# Patient Record
Sex: Male | Born: 1980 | Race: White | Hispanic: No | Marital: Married | Smoking: Never smoker
Health system: Southern US, Community
[De-identification: ages and names within clinical notes are randomized; demographics above are authoritative.]

---
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
## 2014-09-19 ENCOUNTER — Encounter: Payer: Self-pay | Admitting: Family Medicine

## 2014-09-19 ENCOUNTER — Ambulatory Visit (INDEPENDENT_AMBULATORY_CARE_PROVIDER_SITE_OTHER): Payer: BC Managed Care – PPO | Admitting: Family Medicine

## 2014-09-19 VITALS — BP 106/70 | HR 87 | Temp 98.8°F | Ht 72.0 in | Wt 212.5 lb

## 2014-09-19 DIAGNOSIS — Z0000 Encounter for general adult medical examination without abnormal findings: Secondary | ICD-10-CM | POA: Diagnosis not present

## 2014-09-19 DIAGNOSIS — Z7189 Other specified counseling: Secondary | ICD-10-CM

## 2014-09-19 NOTE — Patient Instructions (Signed)
Take care.  Glad to see you.  Keep exercising.  We'll request your records.  Tdap and flu.

## 2014-09-19 NOTE — Progress Notes (Signed)
Pre visit review using our clinic review tool, if applicable. No additional management support is needed unless otherwise documented below in the visit note.  New patient. Requesting records.   CPE- See plan.  Routine anticipatory guidance given to patient.  See health maintenance. Tetanus encouraged.  Flu encouraged.  PNA and shingles not due.  Colon and prostate cancer screening not due, d/w pt.  Diet and exercise are both good, walking, healthy diet.   Living will d/w pt.  Wife designated if patient were incapacitated.    He has some L sided MSK chest wall pain [facility]  Requesting records.    PMH and SH reviewed  Meds, vitals, and allergies reviewed.   ROS: See HPI.  Otherwise negative.    GEN: nad, alert and oriented HEENT: mucous membranes moist NECK: supple w/o LA CV: rrr. PULM: ctab, no inc wob ABD: soft, +bs EXT: no edema SKIN: no acute rash

## 2014-09-20 ENCOUNTER — Encounter: Payer: Self-pay | Admitting: Family Medicine

## 2014-09-20 DIAGNOSIS — Z0000 Encounter for general adult medical examination without abnormal findings: Secondary | ICD-10-CM | POA: Insufficient documentation

## 2014-09-20 DIAGNOSIS — Z7189 Other specified counseling: Secondary | ICD-10-CM | POA: Insufficient documentation

## 2014-09-20 NOTE — Assessment & Plan Note (Signed)
Living will d/w pt.  Wife designated if patient were incapacitated.   ?

## 2014-09-20 NOTE — Assessment & Plan Note (Signed)
Routine anticipatory guidance given to patient. See health maintenance.  Tetanus encouraged.  Flu encouraged.  PNA and shingles not due.  Colon and prostate cancer screening not due, d/w pt.  Diet and exercise are both good, walking, healthy diet.  Living will d/w pt. Wife designated if patient were incapacitated.

## 2018-06-30 ENCOUNTER — Other Ambulatory Visit: Payer: Self-pay

## 2018-06-30 ENCOUNTER — Observation Stay
Admission: EM | Admit: 2018-06-30 | Discharge: 2018-07-01 | Disposition: A | Discharge status: Home / Self Care | Payer: BC Managed Care – PPO | Attending: Surgery | Admitting: Surgery

## 2018-06-30 ENCOUNTER — Observation Stay: Payer: BC Managed Care – PPO | Admitting: Anesthesiology

## 2018-06-30 ENCOUNTER — Encounter: Payer: Self-pay | Admitting: Emergency Medicine

## 2018-06-30 ENCOUNTER — Encounter
Admission: EM | Disposition: A | Discharge status: Home / Self Care | Payer: Self-pay | Source: Home / Self Care | Attending: Emergency Medicine

## 2018-06-30 ENCOUNTER — Emergency Department: Payer: BC Managed Care – PPO

## 2018-06-30 DIAGNOSIS — K3532 Acute appendicitis with perforation and localized peritonitis, without abscess: Principal | ICD-10-CM | POA: Insufficient documentation

## 2018-06-30 DIAGNOSIS — K3580 Unspecified acute appendicitis: Secondary | ICD-10-CM | POA: Diagnosis present

## 2018-06-30 DIAGNOSIS — K3589 Other acute appendicitis without perforation or gangrene: Secondary | ICD-10-CM

## 2018-06-30 DIAGNOSIS — R1031 Right lower quadrant pain: Secondary | ICD-10-CM | POA: Diagnosis present

## 2018-06-30 HISTORY — PX: LAPAROSCOPIC APPENDECTOMY: SHX408

## 2018-06-30 LAB — URINALYSIS, COMPLETE (UACMP) WITH MICROSCOPIC
BACTERIA UA: NONE SEEN
Bilirubin Urine: NEGATIVE
Glucose, UA: NEGATIVE mg/dL
Hgb urine dipstick: NEGATIVE
Ketones, ur: NEGATIVE mg/dL
LEUKOCYTES UA: NEGATIVE
Nitrite: NEGATIVE
PH: 5 (ref 5.0–8.0)
Protein, ur: 100 mg/dL — AB
Specific Gravity, Urine: 1.029 (ref 1.005–1.030)

## 2018-06-30 LAB — CBC
HCT: 49.2 % (ref 39.0–52.0)
HEMOGLOBIN: 17.1 g/dL — AB (ref 13.0–17.0)
MCH: 28.9 pg (ref 26.0–34.0)
MCHC: 34.8 g/dL (ref 30.0–36.0)
MCV: 83.2 fL (ref 80.0–100.0)
NRBC: 0 % (ref 0.0–0.2)
Platelets: 263 10*3/uL (ref 150–400)
RBC: 5.91 MIL/uL — AB (ref 4.22–5.81)
RDW: 12.2 % (ref 11.5–15.5)
WBC: 12.4 10*3/uL — ABNORMAL HIGH (ref 4.0–10.5)

## 2018-06-30 LAB — COMPREHENSIVE METABOLIC PANEL
ALBUMIN: 4.3 g/dL (ref 3.5–5.0)
ALT: 35 U/L (ref 0–44)
ANION GAP: 10 (ref 5–15)
AST: 23 U/L (ref 15–41)
Alkaline Phosphatase: 90 U/L (ref 38–126)
BUN: 15 mg/dL (ref 6–20)
CO2: 22 mmol/L (ref 22–32)
Calcium: 8.7 mg/dL — ABNORMAL LOW (ref 8.9–10.3)
Chloride: 105 mmol/L (ref 98–111)
Creatinine, Ser: 0.99 mg/dL (ref 0.61–1.24)
GFR calc non Af Amer: >60 mL/min (ref >60)
Glucose, Bld: 88 mg/dL (ref 70–99)
Potassium: 3.7 mmol/L (ref 3.5–5.1)
SODIUM: 137 mmol/L (ref 135–145)
Total Bilirubin: 0.8 mg/dL (ref 0.3–1.2)
Total Protein: 7.8 g/dL (ref 6.5–8.1)

## 2018-06-30 LAB — LIPASE, BLOOD: Lipase: 24 U/L (ref 11–51)

## 2018-06-30 SURGERY — APPENDECTOMY, LAPAROSCOPIC
Anesthesia: General

## 2018-06-30 MED ORDER — MIDAZOLAM HCL 2 MG/2ML IJ SOLN
INTRAMUSCULAR | Status: AC
  Filled 2018-06-30: qty 2

## 2018-06-30 MED ORDER — DOCUSATE SODIUM 100 MG PO CAPS: 100.0000 mg | ORAL_CAPSULE | Freq: Two times a day (BID) | ORAL | Status: DC | PRN

## 2018-06-30 MED ORDER — TRAMADOL HCL 50 MG PO TABS
50.0000 mg | ORAL_TABLET | Freq: Four times a day (QID) | ORAL | Status: DC | PRN
  Administered 2018-07-01: 50 mg via ORAL
  Filled 2018-06-30: qty 1

## 2018-06-30 MED ORDER — ACETAMINOPHEN 10 MG/ML IV SOLN
INTRAVENOUS | Status: DC | PRN
  Administered 2018-06-30: 1000 mg via INTRAVENOUS

## 2018-06-30 MED ORDER — FENTANYL CITRATE (PF) 100 MCG/2ML IJ SOLN: 25.0000 ug | INTRAMUSCULAR | Status: DC | PRN

## 2018-06-30 MED ORDER — IOPAMIDOL (ISOVUE-300) INJECTION 61%
30.0000 mL | Freq: Once | INTRAVENOUS | Status: AC | PRN
  Administered 2018-06-30: 30 mL via ORAL
  Filled 2018-06-30: qty 30

## 2018-06-30 MED ORDER — LACTATED RINGERS IV SOLN
INTRAVENOUS | Status: DC
  Administered 2018-06-30 (×3): via INTRAVENOUS

## 2018-06-30 MED ORDER — LACTATED RINGERS IV SOLN: INTRAVENOUS | Status: DC

## 2018-06-30 MED ORDER — CEFAZOLIN SODIUM-DEXTROSE 2-3 GM-%(50ML) IV SOLR
INTRAVENOUS | Status: DC | PRN
  Administered 2018-06-30: 2 g via INTRAVENOUS

## 2018-06-30 MED ORDER — FENTANYL CITRATE (PF) 250 MCG/5ML IJ SOLN
INTRAMUSCULAR | Status: AC
  Filled 2018-06-30: qty 5

## 2018-06-30 MED ORDER — ROCURONIUM BROMIDE 100 MG/10ML IV SOLN
INTRAVENOUS | Status: DC | PRN
  Administered 2018-06-30: 40 mg via INTRAVENOUS
  Administered 2018-06-30: 10 mg via INTRAVENOUS
  Administered 2018-06-30: 30 mg via INTRAVENOUS

## 2018-06-30 MED ORDER — HYDROCODONE-ACETAMINOPHEN 5-325 MG PO TABS
1.0000 | ORAL_TABLET | ORAL | Status: DC | PRN
  Administered 2018-07-01: 1 via ORAL
  Filled 2018-06-30: qty 1

## 2018-06-30 MED ORDER — HYDROMORPHONE HCL 1 MG/ML IJ SOLN
INTRAMUSCULAR | Status: AC
  Filled 2018-06-30: qty 1

## 2018-06-30 MED ORDER — ONDANSETRON HCL 4 MG/2ML IJ SOLN
INTRAMUSCULAR | Status: AC
  Administered 2018-06-30: 4 mg
  Filled 2018-06-30: qty 2

## 2018-06-30 MED ORDER — HYDROMORPHONE HCL 1 MG/ML IJ SOLN
INTRAMUSCULAR | Status: DC | PRN
  Administered 2018-06-30 (×2): .5 mg via INTRAVENOUS

## 2018-06-30 MED ORDER — SUGAMMADEX SODIUM 200 MG/2ML IV SOLN
INTRAVENOUS | Status: DC | PRN
  Administered 2018-06-30: 190.6 mg via INTRAVENOUS

## 2018-06-30 MED ORDER — PROPOFOL 10 MG/ML IV BOLUS
INTRAVENOUS | Status: DC | PRN
  Administered 2018-06-30: 200 mg via INTRAVENOUS

## 2018-06-30 MED ORDER — ONDANSETRON 4 MG PO TBDP: 4.0000 mg | ORAL_TABLET | Freq: Four times a day (QID) | ORAL | Status: DC | PRN

## 2018-06-30 MED ORDER — LIDOCAINE HCL (CARDIAC) PF 100 MG/5ML IV SOSY
PREFILLED_SYRINGE | INTRAVENOUS | Status: DC | PRN
  Administered 2018-06-30: 100 mg via INTRAVENOUS

## 2018-06-30 MED ORDER — MIDAZOLAM HCL 2 MG/2ML IJ SOLN
INTRAMUSCULAR | Status: DC | PRN
  Administered 2018-06-30: 2 mg via INTRAVENOUS

## 2018-06-30 MED ORDER — ACETAMINOPHEN 10 MG/ML IV SOLN
INTRAVENOUS | Status: AC
  Filled 2018-06-30: qty 100

## 2018-06-30 MED ORDER — SUCCINYLCHOLINE CHLORIDE 20 MG/ML IJ SOLN
INTRAMUSCULAR | Status: DC | PRN
  Administered 2018-06-30: 120 mg via INTRAVENOUS

## 2018-06-30 MED ORDER — ONDANSETRON HCL 4 MG/2ML IJ SOLN: 4.0000 mg | Freq: Once | INTRAMUSCULAR | Status: DC | PRN

## 2018-06-30 MED ORDER — MENTHOL 3 MG MT LOZG
1.0000 | LOZENGE | OROMUCOSAL | Status: DC | PRN
  Administered 2018-07-01: 3 mg via ORAL
  Filled 2018-06-30: qty 9

## 2018-06-30 MED ORDER — BUPIVACAINE-EPINEPHRINE 0.5% -1:200000 IJ SOLN
INTRAMUSCULAR | Status: DC | PRN
  Administered 2018-06-30: 20 mL

## 2018-06-30 MED ORDER — PROPOFOL 10 MG/ML IV BOLUS
INTRAVENOUS | Status: AC
  Filled 2018-06-30: qty 20

## 2018-06-30 MED ORDER — MORPHINE SULFATE (PF) 2 MG/ML IV SOLN
2.0000 mg | INTRAVENOUS | Status: DC | PRN
  Administered 2018-06-30 – 2018-07-01 (×3): 2 mg via INTRAVENOUS
  Filled 2018-06-30 (×3): qty 1

## 2018-06-30 MED ORDER — IOHEXOL 300 MG/ML  SOLN
100.0000 mL | Freq: Once | INTRAMUSCULAR | Status: AC | PRN
  Administered 2018-06-30: 100 mL via INTRAVENOUS
  Filled 2018-06-30: qty 100

## 2018-06-30 MED ORDER — ONDANSETRON HCL 4 MG/2ML IJ SOLN
4.0000 mg | Freq: Four times a day (QID) | INTRAMUSCULAR | Status: DC | PRN
  Administered 2018-06-30: 4 mg via INTRAVENOUS

## 2018-06-30 MED ORDER — FENTANYL CITRATE (PF) 100 MCG/2ML IJ SOLN
INTRAMUSCULAR | Status: DC | PRN
  Administered 2018-06-30 (×5): 50 ug via INTRAVENOUS

## 2018-06-30 MED ORDER — DEXAMETHASONE SODIUM PHOSPHATE 10 MG/ML IJ SOLN
INTRAMUSCULAR | Status: DC | PRN
  Administered 2018-06-30: 10 mg via INTRAVENOUS

## 2018-06-30 SURGICAL SUPPLY — 37 items
APPLIER CLIP LOGIC TI 5 (MISCELLANEOUS) ×2 IMPLANT
BLADE SURG 15 STRL LF DISP TIS (BLADE) ×1 IMPLANT
BLADE SURG 15 STRL SS (BLADE) ×1
BLADE SURG SZ11 CARB STEEL (BLADE) ×2 IMPLANT
CANISTER SUCT 1200ML W/VALVE (MISCELLANEOUS) ×2 IMPLANT
CANNULA DILATOR 10 W/SLV (CANNULA) ×2 IMPLANT
COVER WAND RF STERILE (DRAPES) ×2 IMPLANT
CUTTER FLEX LINEAR 45M (STAPLE) ×2 IMPLANT
DERMABOND ADVANCED (GAUZE/BANDAGES/DRESSINGS) ×1
DERMABOND ADVANCED .7 DNX12 (GAUZE/BANDAGES/DRESSINGS) ×1 IMPLANT
ELECT REM PT RETURN 9FT ADLT (ELECTROSURGICAL) ×2
ELECTRODE REM PT RTRN 9FT ADLT (ELECTROSURGICAL) ×1 IMPLANT
GLOVE BIOGEL PI IND STRL 7.0 (GLOVE) ×1 IMPLANT
GLOVE BIOGEL PI INDICATOR 7.0 (GLOVE) ×1
GLOVE SURG SYN 7.0 (GLOVE) ×2 IMPLANT
GOWN STRL REUS W/ TWL LRG LVL3 (GOWN DISPOSABLE) ×1 IMPLANT
GOWN STRL REUS W/TWL LRG LVL3 (GOWN DISPOSABLE) ×1
GRASPER SUT TROCAR 14GX15 (MISCELLANEOUS) ×2 IMPLANT
HANDLE YANKAUER SUCT BULB TIP (MISCELLANEOUS) ×2 IMPLANT
IRRIGATION STRYKERFLOW (MISCELLANEOUS) IMPLANT
IRRIGATOR STRYKERFLOW (MISCELLANEOUS)
KIT TURNOVER KIT A (KITS) ×2 IMPLANT
LIGASURE LAP MARYLAND 5MM 37CM (ELECTROSURGICAL) ×2 IMPLANT
NEEDLE HYPO 22GX1.5 SAFETY (NEEDLE) ×2 IMPLANT
NEEDLE VERESS 14GA 120MM (NEEDLE) ×2 IMPLANT
PACK LAP CHOLECYSTECTOMY (MISCELLANEOUS) ×2 IMPLANT
POUCH ENDO CATCH 10MM SPEC (MISCELLANEOUS) ×2 IMPLANT
RELOAD 45 VASCULAR/THIN (ENDOMECHANICALS) ×2 IMPLANT
RELOAD STAPLE TA45 3.5 REG BLU (ENDOMECHANICALS) ×2 IMPLANT
SCISSORS METZENBAUM CVD 33 (INSTRUMENTS) ×2 IMPLANT
STAPLER SKIN PROX 35W (STAPLE) ×2 IMPLANT
SUT MNCRL AB 4-0 PS2 18 (SUTURE) ×2 IMPLANT
SUT VICRYL PLUS ABS 0 54 (SUTURE) ×2 IMPLANT
TRAY FOLEY MTR SLVR 16FR STAT (SET/KITS/TRAYS/PACK) ×2 IMPLANT
TROCAR XCEL 12X100 BLDLESS (ENDOMECHANICALS) ×2 IMPLANT
TROCAR XCEL NON-BLD 5MMX100MML (ENDOMECHANICALS) ×4 IMPLANT
TUBING INSUFFLATION (TUBING) ×2 IMPLANT

## 2018-06-30 NOTE — ED Notes (Signed)
Pt awaiting admission for surgery. Family remains at bedside.

## 2018-06-30 NOTE — ED Triage Notes (Signed)
Pt in via POV with complaints of RLQ abdominal pain x a few days, worsening last night, developing N/V/D today.  Vitals WDL.  NAD noted at this time.

## 2018-06-30 NOTE — H&P (Signed)
Subjective:   CC: acute appendicitis  HPI:  Kyle Hensley is a 38 y.o. male who is consulted by [facility] for evaluation of  above cc.  Symptoms were first noted 1 week ago. Pain was intermittent discomfort localized to RLQ, acute worsening last night.  Associated with N/V, exacerbated by nothing specific.    Past Medical History: none reportee  Past Surgical History: no surgery in the past  Family History: reviewed and not related to CC  Social History:  reports that he has never smoked. He has never used smokeless tobacco. He reports that he does not drink alcohol or use drugs.  Current Medications: none reported  Allergies:  Allergies as of 06/30/2018  . (No Known Allergies)    ROS:  General: Denies weight loss, weight gain, fatigue, fevers, chills, and night sweats. Eyes: Denies blurry vision, double vision, eye pain, itchy eyes, and tearing. Ears: Denies hearing loss, earache, and ringing in ears. Nose: Denies sinus pain, congestion, infections, runny nose, and nosebleeds. Mouth/throat: Denies hoarseness, sore throat, bleeding gums, and difficulty swallowing. Heart: Denies chest pain, palpitations, racing heart, irregular heartbeat, leg pain or swelling, and decreased activity tolerance. Respiratory: Denies breathing difficulty, shortness of breath, wheezing, cough, and sputum. GI: Denies change in appetite, heartburn, constipation, diarrhea, and blood in stool. GU: Denies difficulty urinating, pain with urinating, urgency, frequency, blood in urine,  Musculoskeletal: Denies joint stiffness, pain, swelling, muscle weakness, and pain. Skin: Denies rash, itching, mass, tumors, sores, and boils Neurologic: Denies headache, fainting, dizziness, seizures, numbness, and tingling. Psychiatric: Denies depression, anxiety, difficulty sleeping, and memory loss. Endocrine: Denies heat or cold intolerance, and increased thirst or urination. Blood/lymph: Denies easy bruising, easy bruising,  and swollen glands    Objective:     BP (!) 141/98 (BP Location: Left Arm)   Pulse 100   Temp 97.9 F (36.6 C) (Oral)   Resp 16   Ht 6' (1.829 m)   Wt 95.3 kg   SpO2 99%   BMI 28.48 kg/m    Constitutional :  alert, cooperative, appears stated age and no distress  Lymphatics/Throat:  no asymmetry, masses, or scars  Respiratory:  clear to auscultation bilaterally  Cardiovascular:  regular rate and rhythm  Gastrointestinal: soft, no guarding, but focal tenderness in RLQ.   Musculoskeletal: Steady gait and movement  Skin: Cool and moist  Psychiatric: Normal affect, non-agitated, not confused       LABS:  CMP Latest Ref Rng & Units 06/30/2018  Glucose 70 - 99 mg/dL 88  BUN 6 - 20 mg/dL 15  Creatinine 1.22 - 4.49 mg/dL 7.53  Sodium 005 - 110 mmol/L 137  Potassium 3.5 - 5.1 mmol/L 3.7  Chloride 98 - 111 mmol/L 105  CO2 22 - 32 mmol/L 22  Calcium 8.9 - 10.3 mg/dL 2.1(R)  Total Protein 6.5 - 8.1 g/dL 7.8  Total Bilirubin 0.3 - 1.2 mg/dL 0.8  Alkaline Phos 38 - 126 U/L 90  AST 15 - 41 U/L 23  ALT 0 - 44 U/L 35   CBC Latest Ref Rng & Units 06/30/2018  WBC 4.0 - 10.5 K/uL 12.4(H)  Hemoglobin 13.0 - 17.0 g/dL 17.1(H)  Hematocrit 39.0 - 52.0 % 49.2  Platelets 150 - 400 K/uL 263     RADS: CLINICAL DATA:  Right lower quadrant abdomen pain for few days.  EXAM: CT ABDOMEN AND PELVIS WITH CONTRAST  TECHNIQUE: Multidetector CT imaging of the abdomen and pelvis was performed using the standard protocol following bolus administration of intravenous  contrast.  CONTRAST:  100mL OMNIPAQUE IOHEXOL 300 MG/ML  SOLN  COMPARISON:  None.  FINDINGS: Lower chest: Minimal dependent atelectasis of lung bases are noted. The heart size is normal.  Hepatobiliary: No focal liver abnormality is seen. No gallstones, gallbladder wall thickening, or biliary dilatation.  Pancreas: Unremarkable. No pancreatic ductal dilatation or surrounding inflammatory changes.  Spleen: Normal  in size without focal abnormality.  Adrenals/Urinary Tract: Adrenal glands are unremarkable. Kidneys are normal, without renal calculi, focal lesion, or hydronephrosis. Bladder is unremarkable.  Stomach/Bowel: The appendix is enlarged with wall enhancement and surrounding inflammation. There is a 2 mm calcific density in the distal appendix. The findings are consistent with acute appendicitis. No focal abscess is noted. There is no small bowel obstruction or diverticulitis. There is a small hiatal hernia.  Vascular/Lymphatic: No significant vascular findings are present. No enlarged abdominal or pelvic lymph nodes.  Reproductive: Prostate is unremarkable.  Other: No abdominal wall hernia or abnormality. No abdominopelvic ascites.  Musculoskeletal: No acute abnormality.  IMPRESSION: Findings consistent with acute appendicitis.  These results will be called to the ordering clinician or representative by the Radiologist Assistant, and communication documented in the PACS or zVision Dashboard.   Electronically Signed   By: Sherian ReinWei-Chen  Lin M.D.   On: 06/30/2018 15:17  Assessment:      Acute appendicitis  Plan:      Discussed the risk of surgery including post-op infxn, seroma, hematoma, abscess formation, chronic pain, poor-delayed wound healing, possible bowel resection, possible ostomy, possible conversion to open procedure, post-op SBO or ileus, and need for additional procedures to address said risks.  The risks of general anesthetic including MI, CVA, sudden death or even reaction to anesthetic medications also discussed. Alternatives include continued observation, or antibiotic treatment.  Benefits include possible symptom relief,   Typical post operative recovery of 3-5 days rest, also discussed.  The patient understands the risks, any and all questions were answered to the patient's satisfaction.  Will proceed with lap appy today.  IV abx, IVF, NPO in the  meantime

## 2018-06-30 NOTE — Transfer of Care (Signed)
Immediate Anesthesia Transfer of Care Note  Patient: Kyle Hensley  Procedure(s) Performed: APPENDECTOMY LAPAROSCOPIC (N/A )  Patient Location: PACU  Anesthesia Type:General  Level of Consciousness: sedated  Airway & Oxygen Therapy: Patient Spontanous Breathing and Patient connected to face mask oxygen  Post-op Assessment: Report given to RN and Post -op Vital signs reviewed and stable  Post vital signs: Reviewed and stable  Last Vitals:  Vitals Value Taken Time  BP    Temp    Pulse 106 06/30/2018  9:49 PM  Resp 15 06/30/2018  9:49 PM  SpO2 100 % 06/30/2018  9:49 PM  Vitals shown include unvalidated device data.  Last Pain:  Vitals:   06/30/18 1941  TempSrc:   PainSc: 4          Complications: No apparent anesthesia complications

## 2018-06-30 NOTE — ED Provider Notes (Signed)
[facility] Emergency Department Provider Note     ____________________________________________   First MD Initiated Contact with Patient 06/30/18 1325     (approximate)  I have reviewed the triage vital signs and the nursing notes.   HISTORY  Chief Complaint Abdominal Pain   HPI Kyle Hensley is a 38 y.o. male patient reports intermittent right lower quadrant pain for several days.  Is been getting worse started yesterday again lasted all night long is present now still getting worse does not feel any lumps in his groin went to urgent care was sent here.  He had one episode of nausea vomiting and diarrhea on arriving here.  His appetite has not been there since last night.   History reviewed. No pertinent past medical history.  Patient Active Problem List   Diagnosis Date Noted  . Routine general medical examination at a health care facility 09/20/2014  . Advance care planning 09/20/2014    History reviewed. No pertinent surgical history.  Prior to Admission medications   Medication Sig Start Date End Date Taking? Authorizing Provider  famotidine (PEPCID) 10 MG tablet Take 10 mg by mouth daily.    [provider]    Allergies Patient has no known allergies.  Family History  Problem Relation Age of Onset  . Colon cancer Neg Hx   . Prostate cancer Neg Hx     Social History Social History   Tobacco Use  . Smoking status: Never Smoker  . Smokeless tobacco: Never Used  Substance Use Topics  . Alcohol use: Never    Alcohol/week: 0.0 standard drinks    Frequency: Never  . Drug use: Never    Review of Systems  Constitutional: No fever/chills Eyes: No visual changes. ENT: No sore throat. Cardiovascular: Denies chest pain. Respiratory: Denies shortness of breath. Gastrointestinal: See HPI Genitourinary: Negative for dysuria. Musculoskeletal: Negative for back pain. Skin: Negative for rash. Neurological: Negative for  headaches, focal weakness   ____________________________________________   PHYSICAL EXAM:  VITAL SIGNS: ED Triage Vitals  Enc Vitals Group     BP 06/30/18 1244 (!) 141/98     Pulse Rate 06/30/18 1244 100     Resp 06/30/18 1244 16     Temp 06/30/18 1244 97.9 F (36.6 C)     Temp Source 06/30/18 1244 Oral     SpO2 06/30/18 1244 99 %     Weight 06/30/18 1245 210 lb (95.3 kg)     Height 06/30/18 1245 6' (1.829 m)     Head Circumference --      Peak Flow --      Pain Score 06/30/18 1245 3     Pain Loc --      Pain Edu? --      Excl. in GC? --     Constitutional: Alert and oriented. Well appearing and in no acute distress. Eyes: Conjunctivae are normal.  Head: Atraumatic. Nose: No congestion/rhinnorhea. Mouth/Throat: Mucous membranes are moist.  Oropharynx non-erythematous. Neck: No stridor.  Cardiovascular: Normal rate, regular rhythm. Grossly normal heart sounds.  Good peripheral circulation. Respiratory: Normal respiratory effort.  No retractions. Lungs CTAB. Gastrointestinal: Soft mildly diffusely tender to palpation percussion worse in the right lower quadrant.  Palpation most the rest of the abdomen makes it hurt in the right lower quadrant as well.  No distention. No abdominal bruits. No CVA tenderness. Musculoskeletal: No lower extremity tenderness nor edema.  No joint effusions. Neurologic:  Normal speech and language. No gross focal neurologic deficits  are appreciated. No gait instability. Skin:  Skin is warm, dry and intact. No rash noted. Psychiatric: Mood and affect are normal. Speech and behavior are normal.  ____________________________________________   LABS (all labs ordered are listed, but only abnormal results are displayed)  Labs Reviewed  COMPREHENSIVE METABOLIC PANEL - Abnormal; Notable for the following components:      Result Value   Calcium 8.7 (*)    All other components within normal limits  CBC - Abnormal; Notable for the following components:     WBC 12.4 (*)    RBC 5.91 (*)    Hemoglobin 17.1 (*)    All other components within normal limits  URINALYSIS, COMPLETE (UACMP) WITH MICROSCOPIC - Abnormal; Notable for the following components:   Color, Urine YELLOW (*)    APPearance HAZY (*)    Protein, ur 100 (*)    All other components within normal limits  LIPASE, BLOOD   ____________________________________________  EKG   ____________________________________________  RADIOLOGY  ED MD interpretation CT report called by radiology is acute appendicitis  Official radiology report(s): Ct Abdomen Pelvis W Contrast  Result Date: 06/30/2018 CLINICAL DATA:  Right lower quadrant abdomen pain for few days. EXAM: CT ABDOMEN AND PELVIS WITH CONTRAST TECHNIQUE: Multidetector CT imaging of the abdomen and pelvis was performed using the standard protocol following bolus administration of intravenous contrast. CONTRAST:  OMNIPAQUE IOHEXOL 300 MG/ML  SOLN COMPARISON:  None. FINDINGS: Lower chest: Minimal dependent atelectasis of lung bases are noted. The heart size is normal. Hepatobiliary: No focal liver abnormality is seen. No gallstones, gallbladder wall thickening, or biliary dilatation. Pancreas: Unremarkable. No pancreatic ductal dilatation or surrounding inflammatory changes. Spleen: Normal in size without focal abnormality. Adrenals/Urinary Tract: Adrenal glands are unremarkable. Kidneys are normal, without renal calculi, focal lesion, or hydronephrosis. Bladder is unremarkable. Stomach/Bowel: The appendix is enlarged with wall enhancement and surrounding inflammation. There is a 2 mm calcific density in the distal appendix. The findings are consistent with acute appendicitis. No focal abscess is noted. There is no small bowel obstruction or diverticulitis. There is a small hiatal hernia. Vascular/Lymphatic: No significant vascular findings are present. No enlarged abdominal or pelvic lymph nodes. Reproductive: Prostate is unremarkable.  Other: No abdominal wall hernia or abnormality. No abdominopelvic ascites. Musculoskeletal: No acute abnormality. IMPRESSION: Findings consistent with acute appendicitis. These results will be called to the ordering clinician or representative by the Radiologist Assistant, and communication documented in the PACS or zVision Dashboard. Electronically Signed   By: Sherian Rein M.D.   On: 06/30/2018 15:17    ____________________________________________   PROCEDURES  Procedure(s) performed:   Procedures  Critical Care performed: ____________________________________________   INITIAL IMPRESSION / ASSESSMENT AND PLAN / ED COURSE Dr. Artis Delay surgery was called he is coming in to see the patient.          ____________________________________________   FINAL CLINICAL IMPRESSION(S) / ED DIAGNOSES  Final diagnoses:  Acute appendicitis, unspecified acute appendicitis type     ED Discharge Orders    None       Note:  This document was prepared using Dragon voice recognition software and may include unintentional dictation errors.    Arnaldo Natal, MD 06/30/18 [phone]

## 2018-06-30 NOTE — Progress Notes (Signed)
Pt states throat really sore  Dr Maisie Fus called  Pt uvula swollen   Ordered cepacol

## 2018-06-30 NOTE — Anesthesia Post-op Follow-up Note (Signed)
Anesthesia QCDR form completed.        

## 2018-06-30 NOTE — Anesthesia Preprocedure Evaluation (Signed)
Anesthesia Evaluation  Patient identified by MRN, date of birth, ID band Patient awake    Reviewed: Allergy & Precautions, NPO status , Patient's Chart, lab work & pertinent test results, reviewed documented beta blocker date and time   Airway Mallampati: II  TM Distance: >3 FB     Dental  (+) Chipped   Pulmonary           Cardiovascular      Neuro/Psych    GI/Hepatic   Endo/Other    Renal/GU      Musculoskeletal   Abdominal   Peds  Hematology   Anesthesia Other Findings   Reproductive/Obstetrics                             Anesthesia Physical Anesthesia Plan  ASA: II  Anesthesia Plan: General   Post-op Pain Management:    Induction: Intravenous  PONV Risk Score and Plan:   Airway Management Planned: Oral ETT  Additional Equipment:   Intra-op Plan:   Post-operative Plan:   Informed Consent: I have reviewed the patients History and Physical, chart, labs and discussed the procedure including the risks, benefits and alternatives for the proposed anesthesia with the patient or authorized representative who has indicated his/her understanding and acceptance.     Plan Discussed with: CRNA  Anesthesia Plan Comments:         Anesthesia Quick Evaluation  

## 2018-06-30 NOTE — Anesthesia Procedure Notes (Signed)
Procedure Name: Intubation Date/Time: 06/30/2018 8:28 PM Performed by: Nelda Marseille, CRNA Pre-anesthesia Checklist: Patient identified, Patient being monitored, Timeout performed, Emergency Drugs available and Suction available Patient Re-evaluated:Patient Re-evaluated prior to induction Oxygen Delivery Method: Circle system utilized Preoxygenation: Pre-oxygenation with 100% oxygen Induction Type: IV induction Ventilation: Mask ventilation without difficulty Laryngoscope Size: Mac, 3 and Glidescope Grade View: Grade II Tube type: Oral Tube size: 7.5 mm Number of attempts: 1 Airway Equipment and Method: Stylet Placement Confirmation: ETT inserted through vocal cords under direct vision,  positive ETCO2 and breath sounds checked- equal and bilateral Secured at: 21 cm Tube secured with: Tape Dental Injury: Teeth and Oropharynx as per pre-operative assessment

## 2018-07-01 ENCOUNTER — Encounter: Payer: Self-pay | Admitting: Surgery

## 2018-07-01 LAB — CBC
HEMATOCRIT: 42.4 % (ref 39.0–52.0)
Hemoglobin: 14.7 g/dL (ref 13.0–17.0)
MCH: 29 pg (ref 26.0–34.0)
MCHC: 34.7 g/dL (ref 30.0–36.0)
MCV: 83.6 fL (ref 80.0–100.0)
Platelets: 245 10*3/uL (ref 150–400)
RBC: 5.07 MIL/uL (ref 4.22–5.81)
RDW: 12.3 % (ref 11.5–15.5)
WBC: 6.8 10*3/uL (ref 4.0–10.5)
nRBC: 0 % (ref 0.0–0.2)

## 2018-07-01 LAB — MAGNESIUM: Magnesium: 1.9 mg/dL (ref 1.7–2.4)

## 2018-07-01 LAB — BASIC METABOLIC PANEL
Anion gap: 9 (ref 5–15)
BUN: 13 mg/dL (ref 6–20)
CO2: 25 mmol/L (ref 22–32)
Calcium: 8.2 mg/dL — ABNORMAL LOW (ref 8.9–10.3)
Chloride: 100 mmol/L (ref 98–111)
Creatinine, Ser: 1.11 mg/dL (ref 0.61–1.24)
GFR calc Af Amer: >60 mL/min (ref >60)
GFR calc non Af Amer: >60 mL/min (ref >60)
Glucose, Bld: 150 mg/dL — ABNORMAL HIGH (ref 70–99)
Potassium: 4 mmol/L (ref 3.5–5.1)
Sodium: 134 mmol/L — ABNORMAL LOW (ref 135–145)

## 2018-07-01 LAB — PHOSPHORUS: Phosphorus: 3 mg/dL (ref 2.5–4.6)

## 2018-07-01 MED ORDER — ACETAMINOPHEN 325 MG PO TABS: 650.0000 mg | ORAL_TABLET | Freq: Three times a day (TID) | ORAL | 0 refills | Status: AC | PRN

## 2018-07-01 MED ORDER — HYDROCODONE-ACETAMINOPHEN 5-325 MG PO TABS: 1.0000 | ORAL_TABLET | Freq: Four times a day (QID) | ORAL | 0 refills | Status: AC | PRN

## 2018-07-01 MED ORDER — DOCUSATE SODIUM 100 MG PO CAPS: 100.0000 mg | ORAL_CAPSULE | Freq: Two times a day (BID) | ORAL | 0 refills | Status: AC | PRN

## 2018-07-01 MED ORDER — IBUPROFEN 800 MG PO TABS: 800.0000 mg | ORAL_TABLET | Freq: Three times a day (TID) | ORAL | 0 refills | Status: DC | PRN

## 2018-07-01 NOTE — Anesthesia Postprocedure Evaluation (Signed)
Anesthesia Post Note  Patient: Neomia DearKevin Schmale  Procedure(s) Performed: APPENDECTOMY LAPAROSCOPIC (N/A )  Patient location during evaluation: PACU Anesthesia Type: General Level of consciousness: awake and alert Pain management: pain level controlled Vital Signs Assessment: post-procedure vital signs reviewed and stable Respiratory status: spontaneous breathing, nonlabored ventilation, respiratory function stable and patient connected to nasal cannula oxygen Cardiovascular status: blood pressure returned to baseline and stable Postop Assessment: no apparent nausea or vomiting Anesthetic complications: no     Last Vitals:  Vitals:   06/30/18 2340 07/01/18 0121  BP: 113/76 105/67  Pulse: 97 89  Resp: 16 16  Temp: 36.8 C 36.7 C  SpO2: 94% 97%    Last Pain:  Vitals:   07/01/18 0121  TempSrc: Oral  PainSc:                  Eleisha Branscomb S

## 2018-07-01 NOTE — Discharge Instructions (Signed)
Laparoscopic Appendectomy, Adult, Care After This sheet gives you information about how to care for yourself after your procedure. Your health care provider may also give you more specific instructions. If you have problems or questions, contact your health care provider. What can I expect after the procedure? After the procedure, it is common to have:  Little energy for normal activities.  Mild pain in the area where the incisions were made.  Difficulty passing stool (constipation). This can be caused by: ? Pain medicine. ? A decrease in your activity. Follow these instructions at home: Medicines  tylenol and advil as needed for discomfort.  Please alternate between the two every four hours as needed for pain.    Use narcotics, if prescribed, only when tylenol and motrin is not enough to control pain.  325-650mg  every 8hrs to max of 4000mg /24hrs (including the 325mg  in every norco dose) for the tylenol.    Advil up to 800mg  per dose every 8hrs as needed for pain.    Do not drive or use heavy machinery while taking prescription pain medicine.  Ask your health care provider if the medicine prescribed to you can cause constipation. You may need to take steps to prevent or treat constipation, such as: ? Drink enough fluid to keep your urine pale yellow. ? Take over-the-counter or prescription medicines. ? Eat foods that are high in fiber, such as beans, whole grains, and fresh fruits and vegetables. ? Limit foods that are high in fat and processed sugars, such as fried or sweet foods. Incision care   Follow instructions from your health care provider about how to take care of your incisions. Make sure you: ? Wash your hands with soap and water before and after you change your bandage (dressing). If soap and water are not available, use hand sanitizer. ? OK TO REMOVE   Check your incision areas every day for signs of infection. Check for: ? Redness, swelling, or pain. ? Fluid or  blood. ? Warmth. ? Pus or a bad smell. Bathing  Keep your incisions clean and dry. Clean them as often as told by your health care provider. To do this: 1. Gently wash the incisions with soap and water. 2. Rinse the incisions with water to remove all soap. 3. Pat the incisions dry with a clean towel. Do not rub the incisions.  Do not take baths, swim, or use a hot tub for 2 weeks, or until your health care provider approves. You may take shower LATER TONIGHT AFTER REMOVING THE OUTER DRESSINGS. Activity  Do not drive for 24 hours if you were given a sedative during your procedure.  Rest after the procedure. OK TO RETURN TO WORK ON Monday 07/05/18.  Ask your health care provider what activities are safe for you. General instructions  Take deep breaths. This helps to prevent your lungs from developing an infection (pneumonia).  Keep all follow-up visits as told by your health care provider. This is important. Contact a health care provider if:  You have redness, swelling, or pain around an incision.  You have fluid or blood coming from an incision.  Your incision feels warm to the touch.  You have pus or a bad smell coming from an incision or dressing.  Your incision edges break open after your sutures have been removed.  You have increasing pain in your shoulders.  You feel dizzy or you faint.  You develop shortness of breath.  You keep feeling nauseous or you are vomiting.  You have diarrhea or you cannot control your bowel functions.  You lose your appetite.  You develop swelling or pain in your legs.  You develop a rash. Get help right away if you have:  A fever.  Difficulty breathing.  Sharp pains in your chest. Summary  After a laparoscopic appendectomy, it is common to have little energy for normal activities, mild pain in the area of the incisions, and constipation.  Infection is the most common complication after this procedure. Follow your health care  provider's instructions about caring for yourself after the procedure.  Rest after the procedure. Return to your normal activities as told by your health care provider.  Contact your health care provider if you notice signs of infection around your incisions or you develop shortness of breath. Get help right away if you have a fever, chest pain, or difficulty breathing. This information is not intended to replace advice given to you by your health care provider. Make sure you discuss any questions you have with your health care provider. Document Released: 06/16/2005 Document Revised: 12/17/2017 Document Reviewed: 12/17/2017 [employer] Interactive Patient Education  2019 [employer]

## 2018-07-01 NOTE — Op Note (Addendum)
Preoperative diagnosis: Acute appendicitis.  Postoperative diagnosis: Acute appendicitis  Procedure: Laparoscopic appendectomy.  Anesthesia: GETA  Surgeon: Sung Amabile  Wound Classification: clean contaminated  Specimen: Appendix  Complications: None  Estimated Blood Loss: 3 mL   Indications: Patient is a 38 y.o. male  presented with right lower quadrant pain.  Computed tomography scan and physical examination were consistent with acute appendicitis.   Findings: 1. Acutely inflamed appendix 2. No peri-appendiceal abscess or phlegmon 3. Normal anatomy 4. Appendiceal artery ligated and divided with EndoGIA 5. Adequate hemostasis.   Description of procedure: The patient was placed on the operating table in the supine position, left arm tucked. General anesthesia was induced. A time-out was completed verifying correct patient, procedure, site, positioning, and implant(s) and/or special equipment prior to beginning this procedure. A Foley catheter placed. The abdomen was prepped and draped in the usual sterile fashion.   After local was infused, an incision was made inferior to the umbilicus.  The fascia was elevated with Coker's and Veress needle introduced and abdomen insufflated with carbon dioxide to a pressure of 15 mmHg after confirming two clicks on the needle and positive saline drop test. The patient tolerated insufflation well.  Needle removed and 69mm port placed via optiview and the abdomen inspected. No injuries from initial trocar placement were noted. One 3mm port  was then placed above the symphysis pubis on midline and 3mm port in LLQ under direct vision.  Care was taken to avoid injury to the bladder or inferior epigastric vessels. The table was placed in the Trendelenburg position with the right side elevated.  An inflamed appendix was identified and elevated.  It was noted to be adjacent to lateral wall of colon before making a sharp turn back toward the pelvis.  Window  created at base of appendix in the mesentery.   An endoscopic blue load linear cutting stapler was then used to divide and staple the base of the appendix.  It was reloaded with a vascular cartridge and the mesoappendix similarly divided after further blunt dissection and ligasure device used to isolate the mesoappendix and inflammed appendix from lateral wall and colon.  Bleeding artery noted in staple line that was easily controlled with a endoclip.  The appendix was placed in an endoscopic retrieval bag and removed through the 74mm port after some blunt dissection within the bag due to bulky inflammed tissue.  The bag was noted to have a small tear once it was pulled through.   The appendiceal stump was examined and hemostasis noted. Scant serosanguinous fluid and minimal blood suctioned out.  No other pathology was identified within pelvis.  54mm port site closed via PMI using 0 vicrly.  Remaining trocars were removed under direct vision. No bleeding was noted. The abdomen was allowed to collapse.  Extensive irrigation of 54mm port site perfomed prior to all skin incisions closed with staples due to the breach of the endocatch bag.  Wounds then dressed with 2x2 and tegaderm.  The patient tolerated the procedure well, foley removed, awakened from anesthesia and was taken to the postanesthesia care unit in satisfactory condition.  Sponge count and instrument count correct at the end of the procedure.

## 2018-07-01 NOTE — Discharge Summary (Signed)
Physician Discharge Summary  Patient ID: Kyle Hensley MRN: [id number] DOB/AGE: 36-12-1980 36 y.o.  Admit date: 06/30/2018 Discharge date: 07/01/2018  Admission Diagnoses: ACUTE APPENDICITIS  Discharge Diagnoses:  Same as above  Discharged Condition: good  Hospital Course: dx with acute appy. Underwent lap appy, see op note for details.  Recovered well postop  Consults: None  Discharge Exam: Blood pressure 108/72, pulse 88, temperature 97.8 F (36.6 C), temperature source Oral, resp. rate 16, height 6' (1.829 m), weight 98.5 kg, SpO2 95 %. General appearance: alert, cooperative and no distress GI: soft, no guarding, appropriate tendernss at incision sites  Disposition:  Discharge disposition: 01-Home or Self Care       Discharge Instructions    Discharge patient   Complete by:  As directed    Discharge disposition:  01-Home or Self Care   Discharge patient date:  07/01/2018     Allergies as of 07/01/2018   No Known Allergies     Medication List    TAKE these medications   acetaminophen 325 MG tablet Commonly known as:  TYLENOL Take 2 tablets (650 mg total) by mouth every 8 (eight) hours as needed for mild pain.   docusate sodium 100 MG capsule Commonly known as:  COLACE Take 1 capsule (100 mg total) by mouth 2 (two) times daily as needed for up to 10 days for mild constipation.   HYDROcodone-acetaminophen 5-325 MG tablet Commonly known as:  NORCO Take 1 tablet by mouth every 6 (six) hours as needed for up to 3 days for moderate pain.   ibuprofen 800 MG tablet Commonly known as:  ADVIL,MOTRIN Take 1 tablet (800 mg total) by mouth every 8 (eight) hours as needed for mild pain or moderate pain.      Follow-up Information    Kalila Adkison, DO Follow up in 1 week(s).   Specialty:  Surgery Why:  for wound check and possible staple removal Contact information: [address] [place] [address] [phone]            Total time spent arranging  discharge was >87min. Signed: Sung Amabile 07/01/2018, 10:21 AM

## 2018-07-02 LAB — SURGICAL PATHOLOGY

## 2018-07-02 LAB — HIV ANTIBODY (ROUTINE TESTING W REFLEX): HIV Screen 4th Generation wRfx: NONREACTIVE

## 2019-09-08 ENCOUNTER — Ambulatory Visit: Payer: BC Managed Care – PPO | Attending: Family

## 2019-09-08 DIAGNOSIS — Z23 Encounter for immunization: Secondary | ICD-10-CM

## 2019-09-08 NOTE — Progress Notes (Signed)
   Covid-19 Vaccination Clinic  Name:  Kyle Hensley    MRN: [id number] DOB: 07-10-1980  09/08/2019  Mr. Kyle Hensley was observed post Covid-19 immunization for 15 minutes without incident. He was provided with Vaccine Information Sheet and instruction to access the V-Safe system.   Mr. Kyle Hensley was instructed to call 911 with any severe reactions post vaccine: Marland Kitchen Difficulty breathing  . Swelling of face and throat  . A fast heartbeat  . A bad rash all over body  . Dizziness and weakness   Immunizations Administered    Name Date Dose VIS Date Route   Moderna COVID-19 Vaccine 09/08/2019 10:31 AM 0.5 mL 05/31/2019 Intramuscular   Manufacturer: Moderna   Lot: [id number]   NDC: 40086-761-95

## 2019-10-11 ENCOUNTER — Ambulatory Visit: Payer: BC Managed Care – PPO | Attending: Family

## 2019-10-11 DIAGNOSIS — Z23 Encounter for immunization: Secondary | ICD-10-CM

## 2019-10-11 NOTE — Progress Notes (Signed)
   Covid-19 Vaccination Clinic  Name:  Kyle Hensley    MRN: [id number] DOB: 13-Feb-1981  10/11/2019  Mr. Winokur was observed post Covid-19 immunization for 15 minutes without incident. He was provided with Vaccine Information Sheet and instruction to access the V-Safe system.   Mr. Nakama was instructed to call 911 with any severe reactions post vaccine: Marland Kitchen Difficulty breathing  . Swelling of face and throat  . A fast heartbeat  . A bad rash all over body  . Dizziness and weakness   Immunizations Administered    Name Date Dose VIS Date Route   Moderna COVID-19 Vaccine 10/11/2019 11:39 AM 0.5 mL 05/31/2019 Intramuscular   Manufacturer: Moderna   Lot: [id number]   NDC: 36629-476-54

## 2020-08-24 IMAGING — CT CT ABD-PELV W/ CM
2 of 4 series · 16 of 46 positions shown, 18 images · IV contrast (APPLIED)
Comparison: None.

CLINICAL DATA: Right lower quadrant abdomen pain for few days.

EXAM:
CT ABDOMEN AND PELVIS WITH CONTRAST
TECHNIQUE: Multidetector CT imaging of the abdomen and pelvis was performed
using the standard protocol following bolus administration of
intravenous contrast.
CONTRAST:  100mL OMNIPAQUE IOHEXOL 300 MG/ML  SOLN

[Series 2: routine abd/pel with · axial · 0.75mm/px · z∈[-516,-41]mm · 13 of 105 slices shown, 15 images]
[im 5/105  soft-tissue]
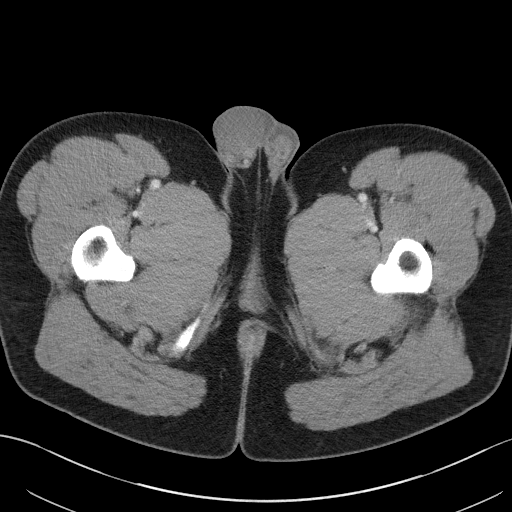
[im 5/105  bone]
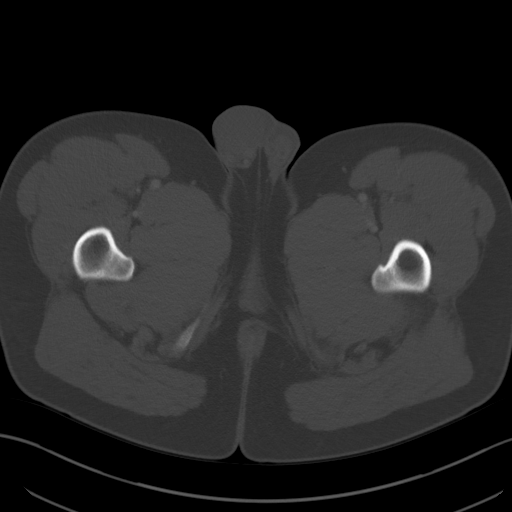
[im 14/105  soft-tissue]
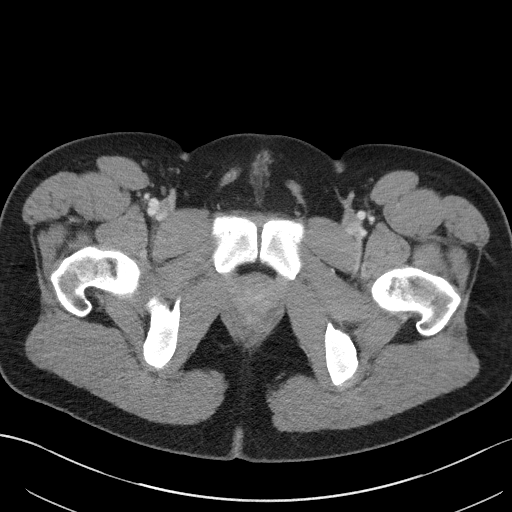
[im 22/105  soft-tissue]
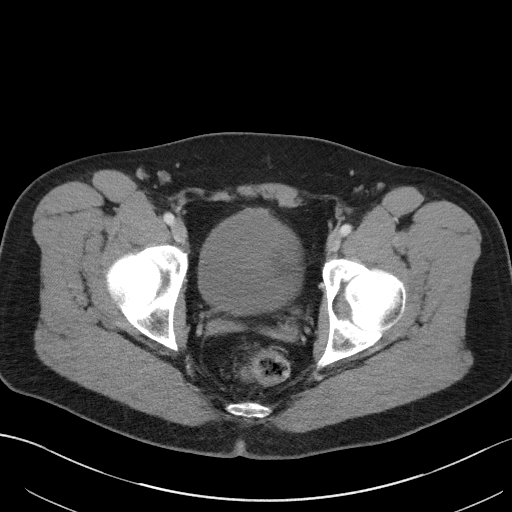
[im 31/105  soft-tissue]
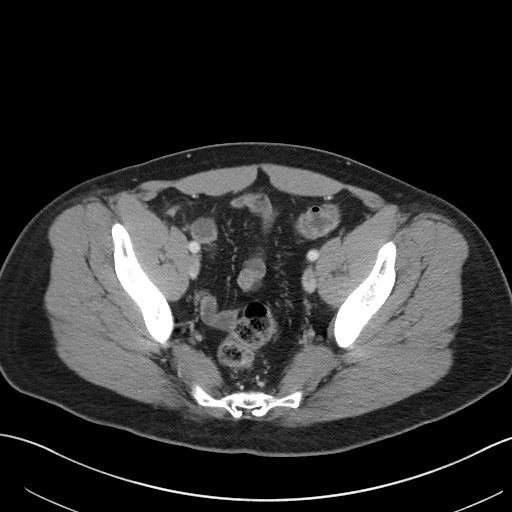
[im 35/105  soft-tissue]
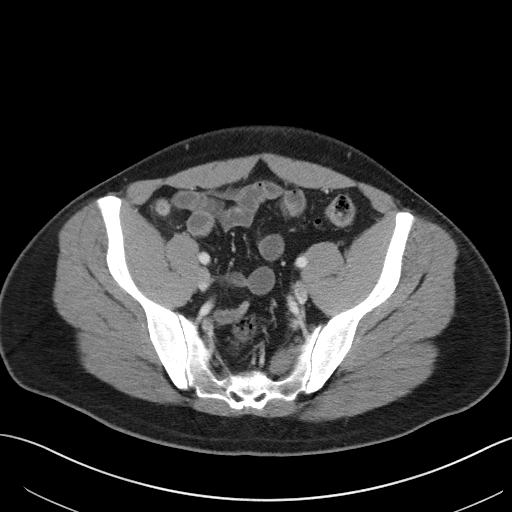
[im 44/105  soft-tissue]
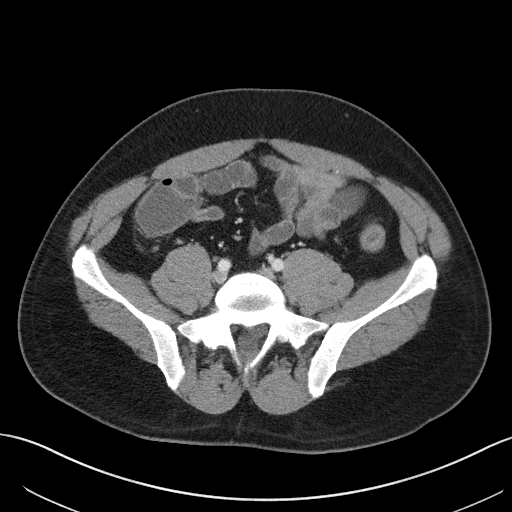
[im 53/105  soft-tissue]
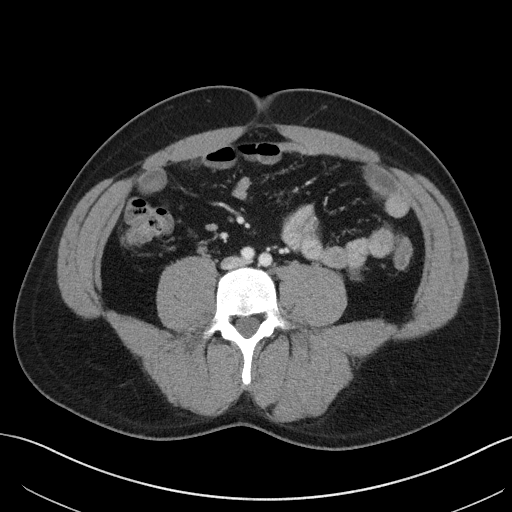
[im 61/105  soft-tissue]
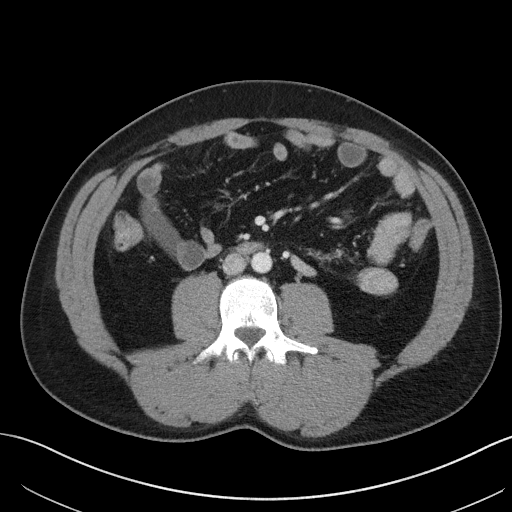
[im 70/105  soft-tissue]
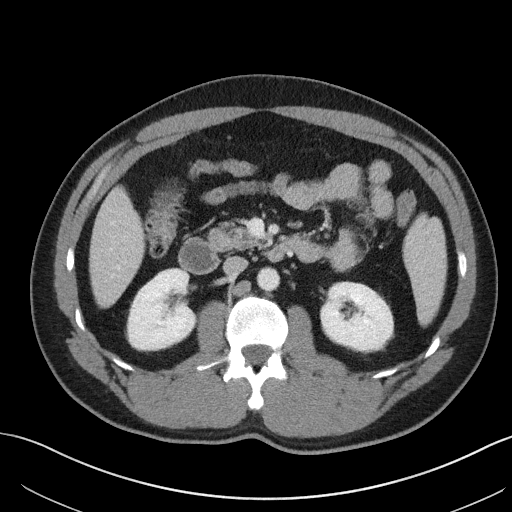
[im 70/105  bone]
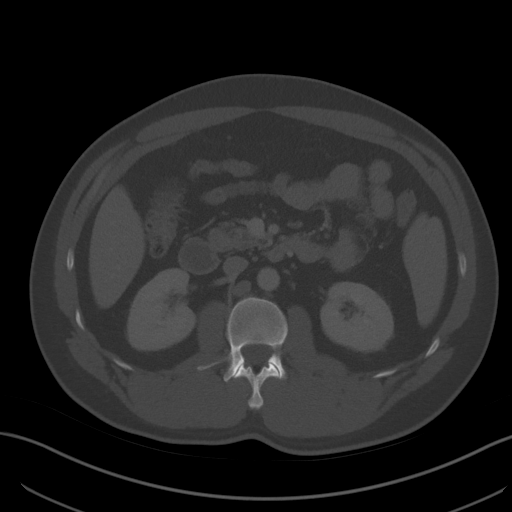
[im 74/105  soft-tissue]
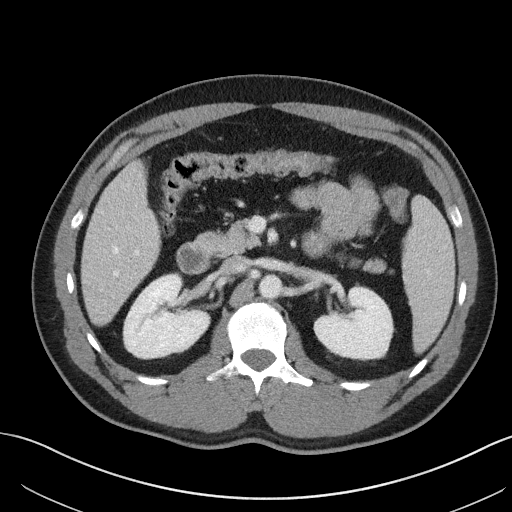
[im 83/105  soft-tissue]
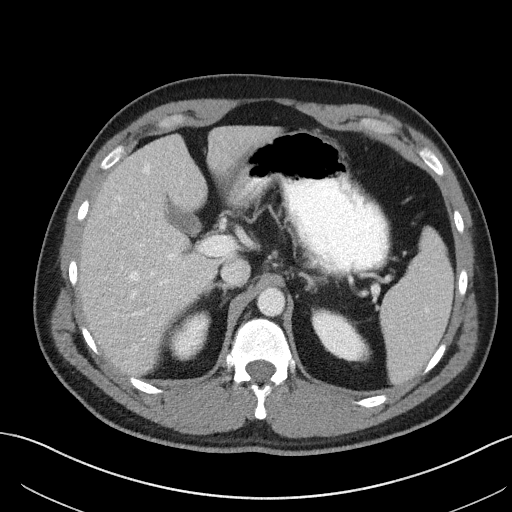
[im 92/105  soft-tissue]
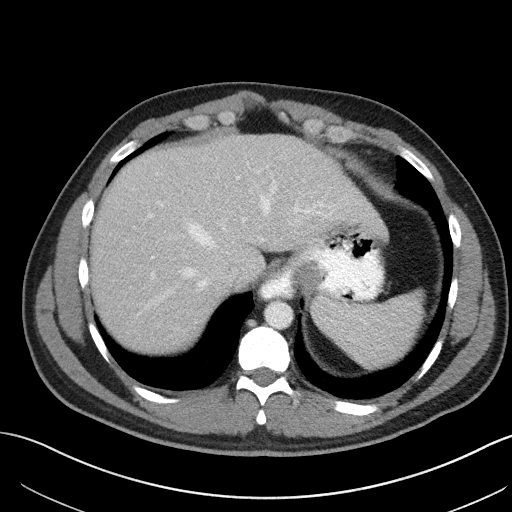
[im 100/105  soft-tissue]
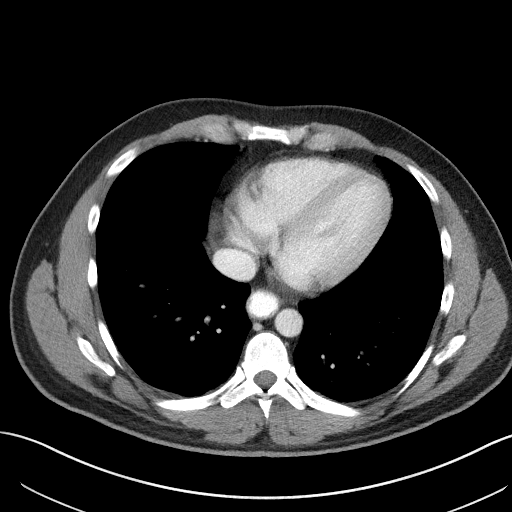

[Series 5: coronal st · coronal · 0.80mm/px · 3 of 95 slices shown]
[im 32/95  soft-tissue]
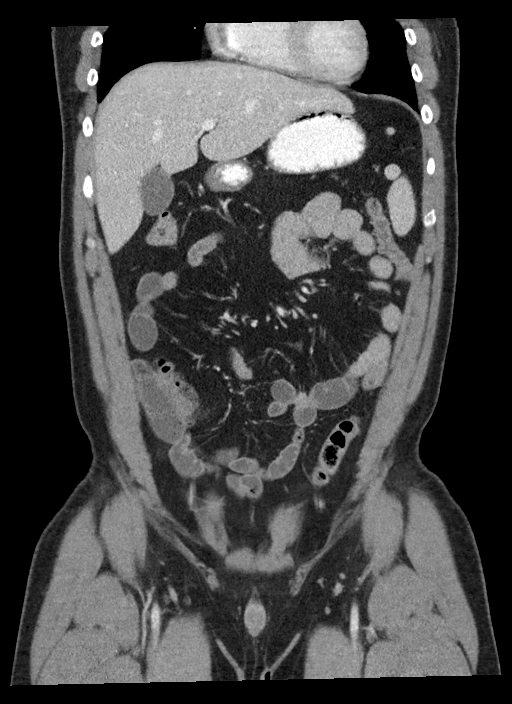
[im 42/95  soft-tissue]
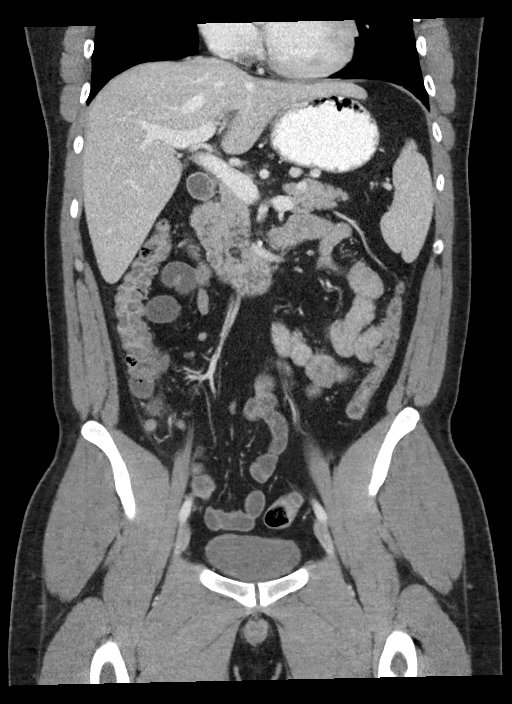
[im 53/95  soft-tissue]
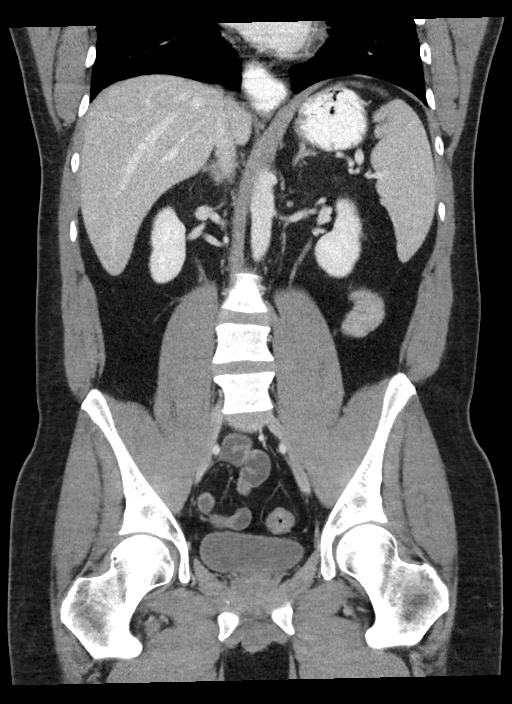

[16 of 46 positions shown; findings below may reference images not displayed]

FINDINGS: Lower chest: Minimal dependent atelectasis of lung bases are noted.
The heart size is normal.

Hepatobiliary: No focal liver abnormality is seen. No gallstones,
gallbladder wall thickening, or biliary dilatation.

Pancreas: Unremarkable. No pancreatic ductal dilatation or
surrounding inflammatory changes.

Spleen: Normal in size without focal abnormality.

Adrenals/Urinary Tract: Adrenal glands are unremarkable. Kidneys are
normal, without renal calculi, focal lesion, or hydronephrosis.
Bladder is unremarkable.

Stomach/Bowel: The appendix is enlarged with wall enhancement and
surrounding inflammation. There is a 2 mm calcific density in the
distal appendix. The findings are consistent with acute
appendicitis. No focal abscess is noted. There is no small bowel
obstruction or diverticulitis. There is a small hiatal hernia.

Vascular/Lymphatic: No significant vascular findings are present. No
enlarged abdominal or pelvic lymph nodes.

Reproductive: Prostate is unremarkable.

Other: No abdominal wall hernia or abnormality. No abdominopelvic
ascites.

Musculoskeletal: No acute abnormality.
IMPRESSION: Findings consistent with acute appendicitis.

These results will be called to the ordering clinician or
representative by the Radiologist Assistant, and communication
documented in the PACS or zVision Dashboard.

## 2022-05-12 ENCOUNTER — Telehealth: Payer: Self-pay | Admitting: Family Medicine

## 2022-05-12 NOTE — Telephone Encounter (Signed)
Patient called in and wanted to know if he could re-establish care with Dr. Para March. Please advise. Thank you!

## 2022-05-12 NOTE — Telephone Encounter (Signed)
Please schedule when possible.  Thanks.  

## 2022-05-12 NOTE — Telephone Encounter (Signed)
Scheduled for 05/19/22.

## 2022-05-19 ENCOUNTER — Encounter: Payer: Self-pay | Admitting: Family Medicine

## 2022-05-19 ENCOUNTER — Ambulatory Visit (INDEPENDENT_AMBULATORY_CARE_PROVIDER_SITE_OTHER): Payer: BC Managed Care – PPO | Admitting: Family Medicine

## 2022-05-19 VITALS — BP 122/80 | HR 92 | Temp 97.9°F | Ht 72.0 in | Wt 217.0 lb

## 2022-05-19 DIAGNOSIS — Z23 Encounter for immunization: Secondary | ICD-10-CM

## 2022-05-19 DIAGNOSIS — Z0000 Encounter for general adult medical examination without abnormal findings: Secondary | ICD-10-CM

## 2022-05-19 DIAGNOSIS — Z7189 Other specified counseling: Secondary | ICD-10-CM

## 2022-05-19 NOTE — Progress Notes (Signed)
CPE- See plan.  Routine anticipatory guidance given to patient.  See health maintenance.  The possibility exists that previously documented standard health maintenance information may have been brought forward from a previous encounter into this note.  If needed, that same information has been updated to reflect the current situation based on today's encounter.    Tetanus d/w pt.   Flu 2023.  PNA and shingles not due.  Covid prev done.   Colon and prostate cancer screening not due, d/w pt.  Diet and exercise are both good, walking, healthy diet.   Living will d/w pt.  Wife designated if patient were incapacitated.   HIV and HCV screening prev done with red cross donation.    Prev cough clearly improved.  BP elevation on mucinex. D/w pt.    PMH and SH reviewed  Meds, vitals, and allergies reviewed.   ROS: Per HPI.  Unless specifically indicated otherwise in HPI, the patient denies:  General: fever. Eyes: acute vision changes ENT: sore throat Cardiovascular: chest pain Respiratory: SOB GI: vomiting GU: dysuria Musculoskeletal: acute back pain Derm: acute rash Neuro: acute motor dysfunction Psych: worsening mood Endocrine: polydipsia Heme: bleeding Allergy: hayfever  GEN: nad, alert and oriented HEENT: mucous membranes moist, OP wnl, TM wnl B NECK: supple w/o LA CV: rrr. PULM: ctab, no inc wob ABD: soft, +bs EXT: no edema SKIN: no acute rash

## 2022-05-19 NOTE — Assessment & Plan Note (Signed)
Tetanus d/w pt.   Flu 2023.  PNA and shingles not due.  Covid prev done.   Colon and prostate cancer screening not due, d/w pt.  Diet and exercise are both good, walking, healthy diet.   Living will d/w pt.  Wife designated if patient were incapacitated.   HIV and HCV screening prev done with red cross donation.

## 2022-05-19 NOTE — Patient Instructions (Signed)
Update me as needed.  Thanks for your effort. Take care.  Glad to see you. I would get a tetanus shot at some point.

## 2022-05-19 NOTE — Assessment & Plan Note (Signed)
Living will d/w pt.  Wife designated if patient were incapacitated.   ?

## 2022-10-09 ENCOUNTER — Encounter: Payer: Self-pay | Admitting: Nurse Practitioner

## 2022-10-09 ENCOUNTER — Ambulatory Visit: Payer: BC Managed Care – PPO | Admitting: Nurse Practitioner

## 2022-10-09 VITALS — BP 130/64 | HR 84 | Temp 98.1°F | Resp 16 | Ht 72.0 in | Wt 221.4 lb

## 2022-10-09 DIAGNOSIS — H6501 Acute serous otitis media, right ear: Secondary | ICD-10-CM | POA: Insufficient documentation

## 2022-10-09 DIAGNOSIS — R0981 Nasal congestion: Secondary | ICD-10-CM | POA: Insufficient documentation

## 2022-10-09 DIAGNOSIS — R051 Acute cough: Secondary | ICD-10-CM | POA: Insufficient documentation

## 2022-10-09 LAB — POC COVID19 BINAXNOW: SARS Coronavirus 2 Ag: NEGATIVE

## 2022-10-09 MED ORDER — FLUTICASONE PROPIONATE 50 MCG/ACT NA SUSP: 2.0000 | Freq: Every day | NASAL | 0 refills | Status: AC

## 2022-10-09 MED ORDER — AMOXICILLIN-POT CLAVULANATE 875-125 MG PO TABS: 1.0000 | ORAL_TABLET | Freq: Two times a day (BID) | ORAL | 0 refills | Status: AC

## 2022-10-09 NOTE — Assessment & Plan Note (Signed)
Will send in some fluticasone nasal spray 50 mics per actuation 2 sprays each nostril daily.  Hopefully will help with some eustachian tube dysfunction I believe he is experiencing

## 2022-10-09 NOTE — Assessment & Plan Note (Signed)
Patient continues an over-the-counter cough suppressant as needed offered to send prescription version patient politely declined

## 2022-10-09 NOTE — Assessment & Plan Note (Signed)
Will treat with Augmentin 875-125 mg twice daily for 7 days.  Over-the-counter analgesics as needed for ear pain

## 2022-10-09 NOTE — Progress Notes (Signed)
Acute Office Visit  Subjective:     Patient ID: Kyle Hensley, male    DOB: 03-Mar-1981, 42 y.o.   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 Negative for chills and fever.       Appetite is normal  Fluid intake is good   HENT:  Positive for congestion, ear pain (full), sinus pain and sore throat.   Respiratory:  Positive for cough. Negative for sputum production and shortness of breath.   Musculoskeletal:  Negative for joint pain and myalgias.  Neurological:  Positive for headaches.        Objective:    BP 130/64   Pulse 84   Temp 98.1 F (36.7 C)   Resp 16   Ht 6' (1.829 m)   Wt 221 lb 6 oz (100.4 kg)   SpO2 99%   BMI 30.02 kg/m    Physical Exam Vitals and nursing note reviewed.  Constitutional:      Appearance: Normal appearance.  HENT:     Right Ear: Ear canal and external ear normal.     Left Ear: Ear canal and external ear normal.     Nose:     Right Sinus: No maxillary sinus tenderness or frontal sinus tenderness.     Left Sinus: No maxillary sinus tenderness or frontal sinus tenderness.     Mouth/Throat:     Mouth: Mucous membranes are moist.     Pharynx: Posterior oropharyngeal erythema present.  Cardiovascular:     Rate and Rhythm: Normal rate and regular rhythm.     Heart sounds: Normal heart sounds.  Pulmonary:     Effort: Pulmonary effort is normal.     Breath sounds: Normal breath sounds.  Lymphadenopathy:     Cervical: No cervical adenopathy.  Neurological:     Mental Status: He is alert.     No  results found for any visits on 10/09/22.      Assessment & Plan:   Problem List Items Addressed This Visit       Nervous and Auditory   Non-recurrent acute serous otitis media of right ear    Will treat with Augmentin 875-125 mg twice daily for 7 days.  Over-the-counter analgesics as needed for ear pain      Relevant Medications   amoxicillin-clavulanate (AUGMENTIN) 875-125 MG tablet     Other   Nasal congestion    Will send in some fluticasone nasal spray 50 mics per actuation 2 sprays each nostril daily.  Hopefully will help with some eustachian tube dysfunction I believe he is experiencing      Relevant Medications   fluticasone (FLONASE) 50 MCG/ACT nasal spray   Acute cough - Primary    Patient continues an over-the-counter cough suppressant as needed offered to send prescription version patient politely declined      Relevant Orders   POC COVID-19 BinaxNow    Meds ordered this encounter  Medications   amoxicillin-clavulanate (AUGMENTIN) 875-125 MG tablet  Sig: Take 1 tablet by mouth 2 (two) times daily for 7 days.    Dispense:  14 tablet    Refill:  0    Order Specific Question:   Supervising Provider    Answer:   Milinda Antis MARNE A [[id number]]   fluticasone (FLONASE) 50 MCG/ACT nasal spray    Sig: Place 2 sprays into both nostrils daily.    Dispense:  16 g    Refill:  0    Order Specific Question:   Supervising Provider    Answer:   TOWER, MARNE A [[id number]]    Return if symptoms worsen or fail to improve.  Audria Nine, NP

## 2022-10-09 NOTE — Patient Instructions (Signed)
Nice to see you today You have the beginnings of an ear infection.  I have sent in antibiotics to cover that and your sinuses Follow up if you do not start improving

## 2022-10-31 ENCOUNTER — Other Ambulatory Visit: Payer: Self-pay | Admitting: Nurse Practitioner

## 2022-10-31 DIAGNOSIS — R0981 Nasal congestion: Secondary | ICD-10-CM

## 2023-01-28 ENCOUNTER — Encounter: Payer: Self-pay | Admitting: Family Medicine

## 2023-01-28 ENCOUNTER — Telehealth: Payer: BC Managed Care – PPO | Admitting: Family Medicine

## 2023-01-28 VITALS — Temp 99.4°F | Ht 72.0 in | Wt 221.0 lb

## 2023-01-28 DIAGNOSIS — U071 COVID-19: Secondary | ICD-10-CM

## 2023-01-28 MED ORDER — BENZONATATE 100 MG PO CAPS: 200.0000 mg | ORAL_CAPSULE | Freq: Three times a day (TID) | ORAL | 0 refills | Status: DC | PRN

## 2023-01-28 MED ORDER — NIRMATRELVIR/RITONAVIR (PAXLOVID)TABLET: 3.0000 | ORAL_TABLET | Freq: Two times a day (BID) | ORAL | 0 refills | Status: AC

## 2023-01-28 NOTE — Progress Notes (Signed)
Virtual Visit via Video note  I connected with Kyle Hensley on 02/14/23 at 1630 by video and verified that I am speaking with the correct person using two identifiers. Kyle Hensley is currently located at home and is currently with alone during visit. The provider, Dana Allan, MD is located in their office at time of visit.  I discussed the limitations, risks, security and privacy concerns of performing an evaluation and management service by video and the availability of in person appointments. I also discussed with the patient that there may be a patient responsible charge related to this service. The patient expressed understanding and agreed to proceed.  Subjective: PCP: Joaquim Nam, MD  Chief Complaint  Patient presents with   Covid Positive    Serve body ache, fever, fatigue,and a little nausea. No SOB     HPI Symptoms started 2 days ago. Headache that progressed.   Tmax 99.4 Fatigue and slight congestion Denies any chest pain or shortness of breath, decrease in appetite.  Hydrating well. Took Tylenol and Nyquil for symptom management Entire family positive for COVID History of COVID infection 2022, Long COVID Requesting Antiviral therapy   ROS: Per HPI  Current Outpatient Medications:    benzonatate (TESSALON PERLES) 100 MG capsule, Take 2 capsules (200 mg total) by mouth 3 (three) times daily as needed for cough., Disp: 20 capsule, Rfl: 0   fluticasone (FLONASE) 50 MCG/ACT nasal spray, Place 2 sprays into both nostrils daily. (Patient not taking: Reported on 01/28/2023), Disp: 16 g, Rfl: 0  Observations/Objective: Physical Exam Vitals reviewed.  Constitutional:      General: He is not in acute distress.    Appearance: Normal appearance. He is not toxic-appearing.  Eyes:     Conjunctiva/sclera: Conjunctivae normal.  Pulmonary:     Effort: Pulmonary effort is normal.  Neurological:     Mental Status: He is alert. Mental status is at baseline.  Psychiatric:         Mood and Affect: Mood normal.        Behavior: Behavior normal.        Thought Content: Thought content normal.        Judgment: Judgment normal.    Assessment and Plan: Positive self-administered antigen test for COVID-19 Assessment & Plan: In no acute respiratory distress.   Start Antiviral therapy Symptom management Strict COVID precautions provided Follow up with PCP if no improvement  Orders: -     nirmatrelvir/ritonavir; Take 3 tablets by mouth 2 (two) times daily for 5 days. (Take nirmatrelvir 150 mg two tablets twice daily for 5 days and ritonavir 100 mg one tablet twice daily for 5 days) Patient GFR is >60  Dispense: 30 tablet; Refill: 0 -     Benzonatate; Take 2 capsules (200 mg total) by mouth 3 (three) times daily as needed for cough.  Dispense: 20 capsule; Refill: 0    Follow Up Instructions: Return if symptoms worsen or fail to improve, for PCP.   I discussed the assessment and treatment plan with the patient. The patient was provided an opportunity to ask questions and all were answered. The patient agreed with the plan and demonstrated an understanding of the instructions.   The patient was advised to call back or seek an in-person evaluation if the symptoms worsen or if the condition fails to improve as anticipated.  The above assessment and management plan was discussed with the patient. The patient verbalized understanding of and has agreed to the management plan.  Patient is aware to call the clinic if symptoms persist or worsen. Patient is aware when to return to the clinic for a follow-up visit. Patient educated on when it is appropriate to go to the emergency department.     Dana Allan, MD

## 2023-01-28 NOTE — Patient Instructions (Signed)
It was a pleasure meeting you today. Thank you for allowing me to take part in your health care.  Our goals for today as we discussed include:  Symptomatic management for fever, muscle aches and headaches  -Tylenol 325-500 mg every 6 hours as needed -Ibuprofen 200 mg every 8 hours as needed  Stay well hydrated Rest as needed with frequent repositioning and ambulation.  Increase activity as soon as tolerated to help with recovery.  Continue wearing masks, hand washing and self isolation until symptom free.  If you have worsening symptoms, especially difficulty breathing please call 911 or have someone take you to the emergency department.    If you have any questions or concerns, please do not hesitate to call the office at [phone].  I look forward to our next visit and until then take care and stay safe.  Regards,   Carollee Leitz, MD   [facility]

## 2023-02-04 ENCOUNTER — Telehealth: Payer: BC Managed Care – PPO | Admitting: Nurse Practitioner

## 2023-02-04 DIAGNOSIS — U071 COVID-19: Secondary | ICD-10-CM | POA: Diagnosis not present

## 2023-02-04 NOTE — Progress Notes (Signed)
Virtual Visit Consent   Kyle Hensley, you are scheduled for a virtual visit with a [facility] provider today. Just as with appointments in the office, your consent must be obtained to participate. Your consent will be active for this visit and any virtual visit you may have with one of our providers in the next 365 days. If you have a MyChart account, a copy of this consent can be sent to you electronically.  As this is a virtual visit, video technology does not allow for your provider to perform a traditional examination. This may limit your provider's ability to fully assess your condition. If your provider identifies any concerns that need to be evaluated in person or the need to arrange testing (such as labs, EKG, etc.), we will make arrangements to do so. Although advances in technology are sophisticated, we cannot ensure that it will always work on either your end or our end. If the connection with a video visit is poor, the visit may have to be switched to a telephone visit. With either a video or telephone visit, we are not always able to ensure that we have a secure connection.  By engaging in this virtual visit, you consent to the provision of healthcare and authorize for your insurance to be billed (if applicable) for the services provided during this visit. Depending on your insurance coverage, you may receive a charge related to this service.  I need to obtain your verbal consent now. Are you willing to proceed with your visit today? Kyle Hensley has provided verbal consent on 02/04/2023 for a virtual visit (video or telephone). Kyle Simas, FNP  Date: 02/04/2023 8:36 AM  Virtual Visit via Video Note   I, Kyle Hensley, connected with  Kyle Hensley  ([id number], [birth year]) on 02/04/23 at  8:45 AM EDT by a video-enabled telemedicine application and verified that I am speaking with the correct person using two identifiers.  Location: Patient: Virtual Visit Location Patient: Home Provider:  Virtual Visit Location Provider: Home Office   I discussed the limitations of evaluation and management by telemedicine and the availability of in person appointments. The patient expressed understanding and agreed to proceed.    History of Present Illness: Kyle Hensley is a 42 y.o. 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  He tested positive for COVID 01/28/23 Had a virtual appointment with his PCP's office  He took Paxlovid at that time and finished 5 days ago   Symptoms last week were body aches and fatigue that completely resolved   He did take a negative test over the weekend  Two days ago he started to feel sick again with nasal congestion and cough now  Tested positive for COVID with at home test yesterday   Prior to this episode he has had COVID once in the past  He did experience some long COVID symptoms at that time  Had an ongoing dry cough and what he explains as "scratchy" breathing   He has been vaccinated for COVID with two boosters   Denies a history of asthma or need for inhalers  Denies being immunocompromised   Problems:  Patient Active Problem List   Diagnosis Date Noted   Nasal congestion 10/09/2022   Non-recurrent acute serous otitis media of right ear 10/09/2022   Acute cough 10/09/2022   Routine general medical examination at a health care facility 09/20/2014   Advance care planning 09/20/2014  Allergies:  Allergies  Allergen Reactions   Mucinex [Guaifenesin Er]     BP elevation   Medications:  Current Outpatient Medications:    benzonatate (TESSALON PERLES) 100 MG capsule, Take 2 capsules (200 mg total) by mouth 3 (three) times daily as needed for cough., Disp: 20 capsule, Rfl: 0   fluticasone (FLONASE) 50 MCG/ACT nasal spray, Place 2 sprays into both nostrils daily. (Patient not taking: Reported on 01/28/2023), Disp: 16 g, Rfl: 0  Observations/Objective: Patient is well-developed,  well-nourished in no acute distress.  Resting comfortably  at home.  Head is normocephalic, atraumatic.  No labored breathing.  Speech is clear and coherent with logical content.  Patient is alert and oriented at baseline.    Assessment and Plan:  1. COVID-19 Rebound infection - low risk   Discussed immune support  Pushing fluids, high protein meals  Vitamin D  Using over the counter medications for symptom management.  May continue benzonatate and flonase as well  Follow up if symptoms persist or worsen as discussed       Follow Up Instructions: I discussed the assessment and treatment plan with the patient. The patient was provided an opportunity to ask questions and all were answered. The patient agreed with the plan and demonstrated an understanding of the instructions.  A copy of instructions were sent to the patient via MyChart unless otherwise noted below.    The patient was advised to call back or seek an in-person evaluation if the symptoms worsen or if the condition fails to improve as anticipated.  Time:  I spent 10 minutes with the patient via telehealth technology discussing the above problems/concerns.    Kyle Simas, FNP

## 2023-02-14 ENCOUNTER — Encounter: Payer: Self-pay | Admitting: Family Medicine

## 2023-02-14 DIAGNOSIS — U071 COVID-19: Secondary | ICD-10-CM | POA: Insufficient documentation

## 2023-02-14 NOTE — Assessment & Plan Note (Signed)
In no acute respiratory distress.   Start Antiviral therapy Symptom management Strict COVID precautions provided Follow up with PCP if no improvement

## 2023-05-10 ENCOUNTER — Other Ambulatory Visit: Payer: Self-pay | Admitting: Family Medicine

## 2023-05-10 DIAGNOSIS — Z13220 Encounter for screening for lipoid disorders: Secondary | ICD-10-CM

## 2023-05-10 DIAGNOSIS — Z131 Encounter for screening for diabetes mellitus: Secondary | ICD-10-CM

## 2023-05-14 ENCOUNTER — Other Ambulatory Visit (INDEPENDENT_AMBULATORY_CARE_PROVIDER_SITE_OTHER): Payer: BC Managed Care – PPO

## 2023-05-14 DIAGNOSIS — Z131 Encounter for screening for diabetes mellitus: Secondary | ICD-10-CM

## 2023-05-14 DIAGNOSIS — Z13220 Encounter for screening for lipoid disorders: Secondary | ICD-10-CM | POA: Diagnosis not present

## 2023-05-14 LAB — LIPID PANEL
Cholesterol: 237 mg/dL — ABNORMAL HIGH (ref 0–200)
HDL: 36.1 mg/dL — ABNORMAL LOW (ref >39.00)
LDL Cholesterol: 179 mg/dL — ABNORMAL HIGH (ref 0–99)
NonHDL: 201.23
Total CHOL/HDL Ratio: 7
Triglycerides: 110 mg/dL (ref 0.0–149.0)
VLDL: 22 mg/dL (ref 0.0–40.0)

## 2023-05-14 LAB — BASIC METABOLIC PANEL
BUN: 12 mg/dL (ref 6–23)
CO2: 28 meq/L (ref 19–32)
Calcium: 8.6 mg/dL (ref 8.4–10.5)
Chloride: 105 meq/L (ref 96–112)
Creatinine, Ser: 1.17 mg/dL (ref 0.40–1.50)
GFR: 78.29 mL/min (ref >60.00)
Glucose, Bld: 92 mg/dL (ref 70–99)
Potassium: 3.7 meq/L (ref 3.5–5.1)
Sodium: 138 meq/L (ref 135–145)

## 2023-05-21 ENCOUNTER — Ambulatory Visit (INDEPENDENT_AMBULATORY_CARE_PROVIDER_SITE_OTHER): Payer: BC Managed Care – PPO | Admitting: Family Medicine

## 2023-05-21 VITALS — BP 136/90 | HR 89 | Temp 98.1°F | Ht 71.0 in | Wt 219.0 lb

## 2023-05-21 DIAGNOSIS — Z0000 Encounter for general adult medical examination without abnormal findings: Secondary | ICD-10-CM

## 2023-05-21 DIAGNOSIS — Z7189 Other specified counseling: Secondary | ICD-10-CM

## 2023-05-21 NOTE — Progress Notes (Signed)
CPE- See plan.  Routine anticipatory guidance given to patient.  See health maintenance.  The possibility exists that previously documented standard health maintenance information may have been brought forward from a previous encounter into this note.  If needed, that same information has been updated to reflect the current situation based on today's encounter.    Tetanus d/w pt.   Flu 2024 PNA and shingles not due.  Covid prev done.   Colon and prostate cancer screening not due, d/w pt.  Diet and exercise are both good, walking, healthy diet.   Living will d/w pt.  Wife designated if patient were incapacitated.   HIV and HCV screening prev done with red cross donation.    Elevated BP noted, rechecked at OV.  Approx 3% ASCVD score, using age 42, d/w pt.  D/w pt about diet and exercise.    PMH and SH reviewed  Meds, vitals, and allergies reviewed.   ROS: Per HPI.  Unless specifically indicated otherwise in HPI, the patient denies:  General: fever. Eyes: acute vision changes ENT: sore throat Cardiovascular: chest pain Respiratory: SOB GI: vomiting GU: dysuria Musculoskeletal: acute back pain Derm: acute rash Neuro: acute motor dysfunction Psych: worsening mood Endocrine: polydipsia Heme: bleeding Allergy: hayfever  GEN: nad, alert and oriented HEENT: ncat NECK: supple w/o LA CV: rrr. PULM: ctab, no inc wob ABD: soft, +bs EXT: no edema SKIN: no acute rash

## 2023-05-21 NOTE — Patient Instructions (Addendum)
Keep working on diet and exercise.  Tdap when possible.  Take care.  Glad to see you.

## 2023-05-24 NOTE — Assessment & Plan Note (Signed)
Living will d/w pt.  Wife designated if patient were incapacitated.   ?

## 2023-05-24 NOTE — Assessment & Plan Note (Signed)
Tetanus d/w pt.   Flu 2024 PNA and shingles not due.  Covid prev done.   Colon and prostate cancer screening not due, d/w pt.  Diet and exercise are both good, walking, healthy diet.   Living will d/w pt.  Wife designated if patient were incapacitated.   HIV and HCV screening prev done with red cross donation.    Elevated BP noted, rechecked at OV.  Approx 3% ASCVD score, using age 42, d/w pt.  D/w pt about diet and exercise.  He can update me as needed.
